# Patient Record
Sex: Male | Born: 2005 | Race: Asian | Hispanic: No | Marital: Single | Smoking: Never smoker
Health system: Southern US, Community
[De-identification: ages and names within clinical notes are randomized; demographics above are authoritative.]

## PROBLEM LIST (undated history)

## (undated) DIAGNOSIS — L709 Acne, unspecified: Secondary | ICD-10-CM

## (undated) HISTORY — DX: Acne, unspecified: L70.9

---
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
## 2023-01-09 NOTE — Progress Notes (Signed)
Called pt unable to leave a message I will mail it the results to pt home address. Gh

## 2023-05-04 ENCOUNTER — Ambulatory Visit (HOSPITAL_COMMUNITY)
Admission: RE | Admit: 2023-05-04 | Discharge: 2023-05-04 | Disposition: A | Discharge status: Home / Self Care | Payer: Medicaid Other | Source: Ambulatory Visit | Attending: Nurse Practitioner | Admitting: Nurse Practitioner

## 2023-05-04 ENCOUNTER — Ambulatory Visit: Payer: Medicaid Other | Admitting: Nurse Practitioner

## 2023-05-04 ENCOUNTER — Encounter: Payer: Self-pay | Admitting: Nurse Practitioner

## 2023-05-04 VITALS — BP 114/73 | HR 90 | Temp 99.0°F | Resp 16 | Ht 71.0 in | Wt 164.6 lb

## 2023-05-04 DIAGNOSIS — Z23 Encounter for immunization: Secondary | ICD-10-CM

## 2023-05-04 DIAGNOSIS — S6991XA Unspecified injury of right wrist, hand and finger(s), initial encounter: Secondary | ICD-10-CM

## 2023-05-04 NOTE — Progress Notes (Signed)
   Subjective   Patient ID: Tony Obrien, male    DOB: 03-14-2006, 17 y.o.   MRN: [id number]  Chief Complaint  Patient presents with   Finger Injury    Right hand fifth digit, Injured playing cricket about one month ago, joint remains swollen, occasionally painful    Referring provider: Ivonne Andrew, NP  Tony Obrien is a 17 y.o. male with Past Medical History: No date: Acne   HPI  Patient presents today for acute visit.  Patient states that he injured his right fifth finger over a month ago while playing ball.  He states that the joints are still swollen and painful at times.  We will check an x-ray today.  Patient does want flu shot today. Denies f/c/s, n/v/d, hemoptysis, PND, leg swelling Denies chest pain or edema    No Known Allergies  Immunization History  Administered Date(s) Administered   HPV 9-valent 12/28/2020, 08/17/2021   Hepatitis A, Ped/Adol-2 Dose 12/28/2020   Hepatitis B, PED/ADOLESCENT 12/28/2020, 03/02/2021   IPV 12/28/2020   Influenza,inj,Quad PF,6+ Mos 08/17/2021   Meningococcal B Recombinant 12/28/2020, 08/17/2021   Moderna Covid-19 Vaccine Bivalent Booster 41yrs & up 08/17/2021   PFIZER Comirnaty(Gray Top)Covid-19 Tri-Sucrose Vaccine 12/28/2020   Td 12/28/2020, 08/17/2021   Tdap 03/01/2021   Varicella 12/28/2020    Tobacco History: Social History   Tobacco Use  Smoking Status Never  Smokeless Tobacco Never   Counseling given: Not Answered   Outpatient Encounter Medications as of 05/04/2023  Medication Sig   acetaminophen (TYLENOL) 500 MG tablet Take 500 mg by mouth every 6 (six) hours as needed.   ondansetron (ZOFRAN) 4 MG tablet Take 1 tablet (4 mg total) by mouth every 6 (six) hours.   No facility-administered encounter medications on file as of 05/04/2023.    Review of Systems  Review of Systems  Constitutional: Negative.   HENT: Negative.    Cardiovascular: Negative.   Gastrointestinal: Negative.    Allergic/Immunologic: Negative.   Neurological: Negative.   Psychiatric/Behavioral: Negative.       Objective:   BP 114/73   Pulse 90   Temp 99 F (37.2 C)   Resp 16   Ht 5\' 11"  (1.803 m)   Wt 164 lb 9.6 oz (74.7 kg)   SpO2 98%   BMI 22.96 kg/m   Wt Readings from Last 5 Encounters:  05/04/23 164 lb 9.6 oz (74.7 kg) (68%, Z= 0.46)*  12/21/22 164 lb 12.8 oz (74.8 kg) (70%, Z= 0.52)*  03/01/21 152 lb 5.4 oz (69.1 kg) (67%, Z= 0.44)*  12/28/20 150 lb (68 kg) (66%, Z= 0.40)*   * Growth percentiles are based on CDC (Boys, 2-20 Years) data.     Physical Exam Vitals and nursing note reviewed.  Constitutional:      General: He is not in acute distress.    Appearance: He is well-developed.  Cardiovascular:     Rate and Rhythm: Normal rate and regular rhythm.  Pulmonary:     Effort: Pulmonary effort is normal.     Breath sounds: Normal breath sounds.  Skin:    General: Skin is warm and dry.  Neurological:     Mental Status: He is alert and oriented to person, place, and time.       Assessment & Plan:   Need for influenza vaccination -     Flu vaccine trivalent PF, 6mos and older(Flulaval,Afluria,Fluarix,Fluzone)     No follow-ups on file.   Ivonne Andrew, NP 05/04/2023

## 2023-05-04 NOTE — Patient Instructions (Signed)
1. Need for influenza vaccination  - Flu vaccine trivalent PF, 6mos and older(Flulaval,Afluria,Fluarix,Fluzone)  2. Hand injury, right, initial encounter  - DG Hand Complete Right; Future  Follow up:  Follow up in 6 months

## 2023-08-29 ENCOUNTER — Encounter: Payer: Self-pay | Admitting: Nurse Practitioner

## 2024-03-10 ENCOUNTER — Encounter (HOSPITAL_COMMUNITY): Payer: Self-pay

## 2024-03-10 ENCOUNTER — Emergency Department (HOSPITAL_COMMUNITY)
Admission: EM | Admit: 2024-03-10 | Discharge: 2024-03-10 | Disposition: A | Discharge status: Home / Self Care | Attending: Emergency Medicine | Admitting: Emergency Medicine

## 2024-03-10 ENCOUNTER — Telehealth: Payer: Self-pay

## 2024-03-10 ENCOUNTER — Other Ambulatory Visit: Payer: Self-pay

## 2024-03-10 DIAGNOSIS — G4709 Other insomnia: Secondary | ICD-10-CM | POA: Diagnosis not present

## 2024-03-10 DIAGNOSIS — G4700 Insomnia, unspecified: Secondary | ICD-10-CM | POA: Diagnosis present

## 2024-03-10 MED ORDER — TEMAZEPAM 15 MG PO CAPS: 15.0000 mg | ORAL_CAPSULE | Freq: Every evening | ORAL | 0 refills | Status: AC | PRN

## 2024-03-10 NOTE — Transitions of Care (Post Inpatient/ED Visit) (Signed)
   03/10/2024  Name: Tony Obrien MRN: [id number] DOB: [birth year]  Today's TOC FU Call Status: Today's TOC FU Call Status:: Unsuccessful Call (1st Attempt) Unsuccessful Call (1st Attempt) Date: 03/10/24  Attempted to reach the patient regarding the most recent Inpatient/ED visit.  Follow Up Plan: Additional outreach attempts will be made to reach the patient to complete the Transitions of Care (Post Inpatient/ED visit) call.   Signature  [employer], [place]

## 2024-03-10 NOTE — ED Provider Notes (Signed)
 [place] EMERGENCY DEPARTMENT AT [facility] Provider Note   CSN: [id number] Arrival date & time: 03/10/24  9360     Patient presents with: Insomnia   Tony Obrien is a 18 y.o. male.    Insomnia     Patient presents to the ED for evaluation of insomnia.  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  He states for the last couple of days he has not been able to sleep.  Patient states he has not slept well since 4 PM Saturday.  He only had 1 hour of sleep yesterday.  Patient states he is supposed to go to work this evening.  Patient has not tried any medications.  He denies any other symptoms any fevers or chills.  Prior to Admission medications   Medication Sig Start Date End Date Taking? Authorizing Provider  temazepam  (RESTORIL ) 15 MG capsule Take 1 capsule (15 mg total) by mouth at bedtime as needed for sleep. 03/10/24  Yes Randol Simmonds, MD  acetaminophen  (TYLENOL ) 500 MG tablet Take 500 mg by mouth every 6 (six) hours as needed.    [provider]  ondansetron  (ZOFRAN ) 4 MG tablet Take 1 tablet (4 mg total) by mouth every 6 (six) hours. 10/14/22   Prosperi, Christian H, PA-C    Allergies: Patient has no known allergies.    Review of Systems  Psychiatric/Behavioral:  The patient has insomnia.     Updated Vital Signs BP (!) 136/90   Pulse 79   Temp 98.6 F (37 C) (Oral)   Resp 18   SpO2 98%   Physical Exam Vitals and nursing note reviewed.  Constitutional:      General: He is not in acute distress.    Appearance: He is well-developed.  HENT:     Head: Normocephalic and atraumatic.     Right Ear: External ear normal.     Left Ear: External ear normal.  Eyes:     General: No scleral icterus.       Right eye: No discharge.        Left eye: No discharge.     Conjunctiva/sclera: Conjunctivae normal.  Neck:     Trachea: No tracheal deviation.  Cardiovascular:     Rate and Rhythm: Normal rate.  Pulmonary:     Effort:  Pulmonary effort is normal. No respiratory distress.     Breath sounds: No stridor.  Abdominal:     General: There is no distension.  Musculoskeletal:        General: No swelling or deformity.     Cervical back: Neck supple.  Skin:    General: Skin is warm and dry.     Findings: No rash.  Neurological:     General: No focal deficit present.     Mental Status: He is alert. Mental status is at baseline.     Cranial Nerves: No dysarthria or facial asymmetry.     Sensory: No sensory deficit.     Motor: No weakness or seizure activity.     (all labs ordered are listed, but only abnormal results are displayed) Labs Reviewed - No data to display  EKG: None  Radiology: No results found.   Procedures   Medications Ordered in the ED - No data to display                                  Medical  Decision Making  Patient presented to ED for evaluation of insomnia.  Patient appears medically stable.  Recommend that he follow-up with a primary care doctor if he continues to have persistent symptoms.  Discussed some strategies to help with working night shifts which certainly can affect his sleep pattern.  Discussed using over-the-counter medications such as Unisom or melatonin.  Will give him 1 dose of restoril      Final diagnoses:  Other insomnia    ED Discharge Orders          Ordered    temazepam  (RESTORIL ) 15 MG capsule  At bedtime PRN        03/10/24 0730               Randol Simmonds, MD 03/10/24 [phone]

## 2024-03-10 NOTE — ED Notes (Signed)
 Pt in bed, pt states that he started working night shift a couple weeks ago and he can't sleep, states that he also has some numbness and tingling in his feet. Pt awake and oriented, resps even and unlabored.  Blanket given, lights dimmed for pt comfort.

## 2024-03-10 NOTE — Discharge Instructions (Addendum)
 Please review the discharge instructions for tips on trying to help you sleep.  Working night shift certainly can affect your sleep pattern.  I did write for 1 dose of a sleeping pill.  Take this today to help you get some sleep.  Make sure you take it at least 9 hours before you have to wake up. This is not something that you would want to take regularly.  You can try over-the-counter sleep aids such as diphenhydramine or melatonin.  Follow-up with the primary care doctor for further treatment if your symptoms persist

## 2024-03-10 NOTE — ED Notes (Signed)
 Pt in bed, pt denies pain, pt states that he is ready to go home, pt verbalized understanding d/c instructions and follow up, pt from department.

## 2024-03-10 NOTE — ED Triage Notes (Signed)
 Pt. Arrives pov for insomnia. Pt. Has been working 3rd shift for 3 weeks. States that he hasn't slept since 4pm Saturday. Slept 1 hr yesterday.

## 2024-03-11 ENCOUNTER — Telehealth: Payer: Self-pay

## 2024-03-11 ENCOUNTER — Emergency Department (HOSPITAL_COMMUNITY)

## 2024-03-11 ENCOUNTER — Encounter (HOSPITAL_COMMUNITY): Payer: Self-pay

## 2024-03-11 ENCOUNTER — Emergency Department (HOSPITAL_COMMUNITY)
Admission: EM | Admit: 2024-03-11 | Discharge: 2024-03-11 | Disposition: A | Discharge status: Home / Self Care | Attending: Emergency Medicine | Admitting: Emergency Medicine

## 2024-03-11 ENCOUNTER — Other Ambulatory Visit: Payer: Self-pay

## 2024-03-11 DIAGNOSIS — Y92410 Unspecified street and highway as the place of occurrence of the external cause: Secondary | ICD-10-CM | POA: Insufficient documentation

## 2024-03-11 DIAGNOSIS — R072 Precordial pain: Secondary | ICD-10-CM | POA: Diagnosis not present

## 2024-03-11 DIAGNOSIS — S80811A Abrasion, right lower leg, initial encounter: Secondary | ICD-10-CM | POA: Diagnosis not present

## 2024-03-11 DIAGNOSIS — Y93I9 Activity, other involving external motion: Secondary | ICD-10-CM | POA: Diagnosis not present

## 2024-03-11 DIAGNOSIS — N1330 Unspecified hydronephrosis: Secondary | ICD-10-CM | POA: Insufficient documentation

## 2024-03-11 DIAGNOSIS — M79631 Pain in right forearm: Secondary | ICD-10-CM | POA: Diagnosis present

## 2024-03-11 DIAGNOSIS — S50811A Abrasion of right forearm, initial encounter: Secondary | ICD-10-CM | POA: Diagnosis not present

## 2024-03-11 LAB — CBC
HCT: 44.2 % (ref 39.0–52.0)
Hemoglobin: 15.3 g/dL (ref 13.0–17.0)
MCH: 28.9 pg (ref 26.0–34.0)
MCHC: 34.6 g/dL (ref 30.0–36.0)
MCV: 83.6 fL (ref 80.0–100.0)
Platelets: 216 K/uL (ref 150–400)
RBC: 5.29 MIL/uL (ref 4.22–5.81)
RDW: 12.2 % (ref 11.5–15.5)
WBC: 10.1 K/uL (ref 4.0–10.5)
nRBC: 0 % (ref 0.0–0.2)

## 2024-03-11 LAB — BASIC METABOLIC PANEL WITH GFR
Anion gap: 10 (ref 5–15)
BUN: 12 mg/dL (ref 6–20)
CO2: 23 mmol/L (ref 22–32)
Calcium: 9.2 mg/dL (ref 8.9–10.3)
Chloride: 102 mmol/L (ref 98–111)
Creatinine, Ser: 0.84 mg/dL (ref 0.61–1.24)
GFR, Estimated: >60 mL/min (ref >60)
Glucose, Bld: 96 mg/dL (ref 70–99)
Potassium: 3.5 mmol/L (ref 3.5–5.1)
Sodium: 135 mmol/L (ref 135–145)

## 2024-03-11 MED ORDER — MORPHINE SULFATE (PF) 4 MG/ML IV SOLN
4.0000 mg | Freq: Once | INTRAVENOUS | Status: AC
  Administered 2024-03-11: 4 mg via INTRAVENOUS
  Filled 2024-03-11: qty 1

## 2024-03-11 MED ORDER — BACITRACIN ZINC 500 UNIT/GM EX OINT
TOPICAL_OINTMENT | Freq: Two times a day (BID) | CUTANEOUS | Status: DC
  Administered 2024-03-11: 1 via TOPICAL
  Filled 2024-03-11: qty 0.9

## 2024-03-11 MED ORDER — ONDANSETRON HCL 4 MG/2ML IJ SOLN
4.0000 mg | Freq: Once | INTRAMUSCULAR | Status: AC
  Administered 2024-03-11: 4 mg via INTRAVENOUS
  Filled 2024-03-11: qty 2

## 2024-03-11 MED ORDER — OXYCODONE-ACETAMINOPHEN 5-325 MG PO TABS
1.0000 | ORAL_TABLET | Freq: Three times a day (TID) | ORAL | 0 refills | Status: AC | PRN
Start: 2024-03-11 — End: 2024-03-14

## 2024-03-11 MED ORDER — IOHEXOL 350 MG/ML SOLN
75.0000 mL | Freq: Once | INTRAVENOUS | Status: AC | PRN
  Administered 2024-03-11: 75 mL via INTRAVENOUS
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
## 2024-03-11 NOTE — Transitions of Care (Post Inpatient/ED Visit) (Signed)
   03/11/2024  Name: Raequan Vanschaick MRN: [id number] DOB: 2006/05/11  Today's TOC FU Call Status:   Patient's Name and Date of Birth confirmed.  Transition Care Management Follow-up Telephone Call Date of Discharge: 03/10/24 Discharge Facility: [facility]) Type of Discharge: Emergency Department  Items Reviewed:    Medications Reviewed Today: Medications Reviewed Today   Medications were not reviewed in this encounter     Home Care and Equipment/Supplies:    Functional Questionnaire:    Follow up appointments reviewed:      [facility], RMA
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
## 2024-03-18 ENCOUNTER — Telehealth: Payer: Self-pay

## 2024-03-18 NOTE — Transitions of Care (Post Inpatient/ED Visit) (Signed)
   03/18/2024  Name: Tony Obrien MRN: [id number] DOB: 2006/01/14  Today's TOC FU Call Status:   Patient's Name and Date of Birth confirmed.  Transition Care Management Follow-up Telephone Call Type of Discharge: Emergency Department Reason for ED Visit: Orthopedic Conditions How have you been since you were released from the hospital?: Better Any questions or concerns?: No  Items Reviewed: Did you receive and understand the discharge instructions provided?: Yes Medications obtained,verified, and reconciled?: Yes (Medications Reviewed) Any new allergies since your discharge?: No Dietary orders reviewed?: NA Do you have support at home?: Yes Name of Support/Comfort Primary Source: Family  Medications Reviewed Today: Medications Reviewed Today     Reviewed by Starlene Charlynn BIRCH, CMA (Certified Medical Assistant) on 03/18/24 at 1028  Med List Status: <None>   Medication Order Taking? Sig Documenting Provider Last Dose Status Informant  acetaminophen  (TYLENOL ) 500 MG tablet [id number] Yes Take 500 mg by mouth every 6 (six) hours as needed. [provider]  Active            Med Note SAMULE IHA   Thu Dec 21, 2022  2:47 PM) prn  ondansetron  (ZOFRAN ) 4 MG tablet [id number] Yes Take 1 tablet (4 mg total) by mouth every 6 (six) hours. Prosperi, Sherlean DEL, PA-C  Active            Med Note SAMULE IHA   Thu Dec 21, 2022  2:47 PM) Prn   temazepam  (RESTORIL ) 15 MG capsule [id number] Yes Take 1 capsule (15 mg total) by mouth at bedtime as needed for sleep. Randol Simmonds, MD  Active             Home Care and Equipment/Supplies: Were Home Health Services Ordered?: NA Any new equipment or medical supplies ordered?: NA  Functional Questionnaire: Do you need assistance with bathing/showering or dressing?: No Do you need assistance with meal preparation?: No Do you need assistance with eating?: No Do you have difficulty maintaining continence: No Do you need assistance  with getting out of bed/getting out of a chair/moving?: No Do you have difficulty managing or taking your medications?: No  Follow up appointments reviewed: PCP Follow-up appointment confirmed?: Yes Date of PCP follow-up appointment?: 03/21/24 Specialist Hospital Follow-up appointment confirmed?: NA Do you need transportation to your follow-up appointment?: No Do you understand care options if your condition(s) worsen?: Yes-patient verbalized understanding  SDOH Interventions Today    Flowsheet Row Most Recent Value  SDOH Interventions   Food Insecurity Interventions Intervention Not Indicated  Housing Interventions Intervention Not Indicated  Transportation Interventions Intervention Not Indicated    SIGNATURE Masoud Nyce, RMA

## 2024-03-21 ENCOUNTER — Ambulatory Visit (INDEPENDENT_AMBULATORY_CARE_PROVIDER_SITE_OTHER): Payer: Self-pay | Admitting: Nurse Practitioner

## 2024-03-21 ENCOUNTER — Encounter: Payer: Self-pay | Admitting: Nurse Practitioner

## 2024-03-21 VITALS — BP 121/81 | Temp 98.1°F | Wt 177.4 lb

## 2024-03-21 DIAGNOSIS — R41840 Attention and concentration deficit: Secondary | ICD-10-CM

## 2024-03-21 DIAGNOSIS — R454 Irritability and anger: Secondary | ICD-10-CM | POA: Diagnosis not present

## 2024-03-21 DIAGNOSIS — G4700 Insomnia, unspecified: Secondary | ICD-10-CM | POA: Diagnosis not present

## 2024-03-21 MED ORDER — TRAZODONE HCL 50 MG PO TABS: 25.0000 mg | ORAL_TABLET | Freq: Every evening | ORAL | 3 refills | Status: DC | PRN

## 2024-03-21 MED ORDER — TRAZODONE HCL 50 MG PO TABS: 25.0000 mg | ORAL_TABLET | Freq: Every evening | ORAL | 3 refills | Status: AC | PRN
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
## 2024-04-29 ENCOUNTER — Ambulatory Visit (INDEPENDENT_AMBULATORY_CARE_PROVIDER_SITE_OTHER): Payer: Self-pay | Admitting: Nurse Practitioner

## 2024-04-29 ENCOUNTER — Encounter: Payer: Self-pay | Admitting: Nurse Practitioner

## 2024-04-29 VITALS — BP 117/69 | HR 83 | Temp 98.2°F | Wt 181.0 lb

## 2024-04-29 DIAGNOSIS — Z23 Encounter for immunization: Secondary | ICD-10-CM

## 2024-04-29 DIAGNOSIS — M545 Low back pain, unspecified: Secondary | ICD-10-CM

## 2024-04-29 MED ORDER — CYCLOBENZAPRINE HCL 10 MG PO TABS: 10.0000 mg | ORAL_TABLET | Freq: Three times a day (TID) | ORAL | 0 refills | Status: AC | PRN

## 2024-04-29 MED ORDER — DICLOFENAC SODIUM 1 % EX GEL: 4.0000 g | Freq: Four times a day (QID) | CUTANEOUS | 2 refills | Status: AC

## 2024-04-29 MED ORDER — CYCLOBENZAPRINE HCL 10 MG PO TABS: 10.0000 mg | ORAL_TABLET | Freq: Three times a day (TID) | ORAL | 0 refills | Status: DC | PRN

## 2024-04-29 NOTE — Progress Notes (Signed)
 Subjective   Patient ID: Tony Obrien, male    DOB: 21-Jul-2006, 18 y.o.   MRN: [id number]  Chief Complaint  Patient presents with   Back Pain    Referring provider: Oley Bascom RAMAN, NP  Tony Obrien is a 18 y.o. male with Past Medical History: No date: Acne   Back Pain    Patient presents today for an acute visit.  He states that he has been having back pain since yesterday.  He states that the back pain was severe yesterday and he did take Tylenol  yesterday afternoon.  After resting the pain has improved this morning but is still present.  He is having pain to his low back and left lower side.  He does work at a job with heavy lifting.  He denies any recent injuries.  We will trial Flexeril  and Voltaren  gel. Denies f/c/s, n/v/d, hemoptysis, PND, leg swelling Denies chest pain or edema      No Known Allergies  Immunization History  Administered Date(s) Administered   HPV 9-valent 12/28/2020, 08/17/2021   Hepatitis A, Ped/Adol-2 Dose 12/28/2020   Hepatitis B, PED/ADOLESCENT 12/28/2020, 03/02/2021   IPV 12/28/2020   Influenza, Seasonal, Injecte, Preservative Fre 05/04/2023, 04/29/2024   Influenza,inj,Quad PF,6+ Mos 08/17/2021   Meningococcal B Recombinant 12/28/2020, 08/17/2021   Moderna Covid-19 Vaccine Bivalent Booster 63yrs & up 08/17/2021   PFIZER Comirnaty(Gray Top)Covid-19 Tri-Sucrose Vaccine 12/28/2020   Td 12/28/2020, 08/17/2021   Tdap 03/01/2021   Varicella 12/28/2020    Tobacco History: Social History   Tobacco Use  Smoking Status Never  Smokeless Tobacco Never   Counseling given: Not Answered   Outpatient Encounter Medications as of 04/29/2024  Medication Sig   acetaminophen  (TYLENOL ) 500 MG tablet Take 500 mg by mouth every 6 (six) hours as needed.   diclofenac  Sodium (VOLTAREN ) 1 % GEL Apply 4 g topically 4 (four) times daily.   ondansetron  (ZOFRAN ) 4 MG tablet Take 1 tablet (4 mg total) by mouth every 6 (six) hours.   temazepam   (RESTORIL ) 15 MG capsule Take 1 capsule (15 mg total) by mouth at bedtime as needed for sleep.   traZODone  (DESYREL ) 50 MG tablet Take 0.5-1 tablets (25-50 mg total) by mouth at bedtime as needed for sleep.   [DISCONTINUED] cyclobenzaprine  (FLEXERIL ) 10 MG tablet Take 1 tablet (10 mg total) by mouth 3 (three) times daily as needed for muscle spasms.   cyclobenzaprine  (FLEXERIL ) 10 MG tablet Take 1 tablet (10 mg total) by mouth 3 (three) times daily as needed for muscle spasms.   No facility-administered encounter medications on file as of 04/29/2024.    Review of Systems  Review of Systems  Constitutional: Negative.   HENT: Negative.    Cardiovascular: Negative.   Gastrointestinal: Negative.   Musculoskeletal:  Positive for back pain.  Allergic/Immunologic: Negative.   Neurological: Negative.   Psychiatric/Behavioral: Negative.       Objective:   BP 117/69   Pulse 83   Temp 98.2 F (36.8 C) (Oral)   Wt 181 lb (82.1 kg)   SpO2 100%   BMI 24.55 kg/m   Wt Readings from Last 5 Encounters:  04/29/24 181 lb (82.1 kg)  03/21/24 177 lb 6.4 oz (80.5 kg) (78%, Z= 0.78)*  03/11/24 170 lb (77.1 kg) (71%, Z= 0.54)*  05/04/23 164 lb 9.6 oz (74.7 kg) (68%, Z= 0.46)*  12/21/22 164 lb 12.8 oz (74.8 kg) (70%, Z= 0.52)*   * Growth percentiles are based on CDC (Boys, 2-20 Years) data.  Physical Exam Vitals and nursing note reviewed.  Constitutional:      General: He is not in acute distress.    Appearance: He is well-developed.  Cardiovascular:     Rate and Rhythm: Normal rate and regular rhythm.  Pulmonary:     Effort: Pulmonary effort is normal.     Breath sounds: Normal breath sounds.  Musculoskeletal:     Thoracic back: Tenderness present. Decreased range of motion.       Back:  Skin:    General: Skin is warm and dry.  Neurological:     Mental Status: He is alert and oriented to person, place, and time.       Assessment & Plan:   Immunization due -     Flu  vaccine trivalent PF, 6mos and older(Flulaval,Afluria,Fluarix,Fluzone)  Acute left-sided low back pain without sciatica -     Cyclobenzaprine  HCl; Take 1 tablet (10 mg total) by mouth 3 (three) times daily as needed for muscle spasms.  Dispense: 30 tablet; Refill: 0  Other orders -     Diclofenac  Sodium; Apply 4 g topically 4 (four) times daily.  Dispense: 100 g; Refill: 2     Return if symptoms worsen or fail to improve.   Bascom GORMAN Borer, NP 04/29/2024

## 2024-06-23 ENCOUNTER — Ambulatory Visit: Payer: Self-pay | Admitting: Nurse Practitioner

## 2024-07-18 ENCOUNTER — Encounter: Payer: Self-pay | Admitting: Nurse Practitioner

## 2024-07-18 ENCOUNTER — Ambulatory Visit: Payer: Self-pay | Admitting: Nurse Practitioner

## 2024-07-18 DIAGNOSIS — R400 Somnolence: Secondary | ICD-10-CM
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
